# Patient Record
Sex: Male | Born: 1955 | Race: White | Hispanic: No | Marital: Single | Smoking: Former smoker
Health system: Southern US, Community
[De-identification: ages and names within clinical notes are randomized; demographics above are authoritative.]

## PROBLEM LIST (undated history)

## (undated) DIAGNOSIS — I639 Cerebral infarction, unspecified: Secondary | ICD-10-CM

## (undated) DIAGNOSIS — E785 Hyperlipidemia, unspecified: Secondary | ICD-10-CM

## (undated) DIAGNOSIS — E079 Disorder of thyroid, unspecified: Secondary | ICD-10-CM

## (undated) DIAGNOSIS — J309 Allergic rhinitis, unspecified: Secondary | ICD-10-CM

## (undated) HISTORY — DX: Hyperlipidemia, unspecified: E78.5

## (undated) HISTORY — DX: Cerebral infarction, unspecified: I63.9

## (undated) HISTORY — DX: Allergic rhinitis, unspecified: J30.9

---

## 2015-02-06 ENCOUNTER — Encounter (HOSPITAL_COMMUNITY): Payer: Self-pay | Admitting: Emergency Medicine

## 2015-02-06 ENCOUNTER — Emergency Department (INDEPENDENT_AMBULATORY_CARE_PROVIDER_SITE_OTHER)
Admission: EM | Admit: 2015-02-06 | Discharge: 2015-02-06 | Disposition: A | Discharge status: Nursing Home (Includes ICF) | Payer: Self-pay | Source: Home / Self Care

## 2015-02-06 DIAGNOSIS — E079 Disorder of thyroid, unspecified: Secondary | ICD-10-CM | POA: Insufficient documentation

## 2015-02-06 DIAGNOSIS — R1012 Left upper quadrant pain: Secondary | ICD-10-CM

## 2015-02-06 DIAGNOSIS — Z79899 Other long term (current) drug therapy: Secondary | ICD-10-CM | POA: Insufficient documentation

## 2015-02-06 DIAGNOSIS — Z7982 Long term (current) use of aspirin: Secondary | ICD-10-CM | POA: Insufficient documentation

## 2015-02-06 DIAGNOSIS — R1013 Epigastric pain: Secondary | ICD-10-CM | POA: Insufficient documentation

## 2015-02-06 LAB — COMPREHENSIVE METABOLIC PANEL
ALT: 19 U/L (ref 17–63)
ANION GAP: 8 (ref 5–15)
AST: 21 U/L (ref 15–41)
Albumin: 4.1 g/dL (ref 3.5–5.0)
Alkaline Phosphatase: 67 U/L (ref 38–126)
BILIRUBIN TOTAL: 1.1 mg/dL (ref 0.3–1.2)
BUN: 14 mg/dL (ref 6–20)
CO2: 25 mmol/L (ref 22–32)
Calcium: 8.8 mg/dL — ABNORMAL LOW (ref 8.9–10.3)
Chloride: 105 mmol/L (ref 101–111)
Creatinine, Ser: 0.82 mg/dL (ref 0.61–1.24)
Glucose, Bld: 123 mg/dL — ABNORMAL HIGH (ref 65–99)
POTASSIUM: 3.5 mmol/L (ref 3.5–5.1)
Sodium: 138 mmol/L (ref 135–145)
TOTAL PROTEIN: 6.4 g/dL — AB (ref 6.5–8.1)

## 2015-02-06 LAB — URINALYSIS, ROUTINE W REFLEX MICROSCOPIC
BILIRUBIN URINE: NEGATIVE
Glucose, UA: NEGATIVE mg/dL
Hgb urine dipstick: NEGATIVE
KETONES UR: NEGATIVE mg/dL
LEUKOCYTES UA: NEGATIVE
NITRITE: NEGATIVE
Protein, ur: NEGATIVE mg/dL
SPECIFIC GRAVITY, URINE: 1.026 (ref 1.005–1.030)
UROBILINOGEN UA: 1 mg/dL (ref 0.0–1.0)
pH: 6 (ref 5.0–8.0)

## 2015-02-06 LAB — CBC
HEMATOCRIT: 42.6 % (ref 39.0–52.0)
Hemoglobin: 15.7 g/dL (ref 13.0–17.0)
MCH: 32.1 pg (ref 26.0–34.0)
MCHC: 36.9 g/dL — ABNORMAL HIGH (ref 30.0–36.0)
MCV: 87.1 fL (ref 78.0–100.0)
Platelets: 159 10*3/uL (ref 150–400)
RBC: 4.89 MIL/uL (ref 4.22–5.81)
RDW: 12.4 % (ref 11.5–15.5)
WBC: 6.7 10*3/uL (ref 4.0–10.5)

## 2015-02-06 LAB — LIPASE, BLOOD: LIPASE: 65 U/L — AB (ref 22–51)

## 2015-02-06 NOTE — Discharge Instructions (Signed)
Abdominal Pain Many things can cause abdominal pain. Usually, abdominal pain is not caused by a disease and will improve without treatment. It can often be observed and treated at home. Your health care provider will do a physical exam and possibly order blood tests and X-rays to help determine the seriousness of your pain. However, in many cases, more time must pass before a clear cause of the pain can be found. Before that point, your health care provider may not know if you need more testing or further treatment. HOME CARE INSTRUCTIONS  Monitor your abdominal pain for any changes. The following actions may help to alleviate any discomfort you are experiencing:  Only take over-the-counter or prescription medicines as directed by your health care provider.  Do not take laxatives unless directed to do so by your health care provider.  Try a clear liquid diet (broth, tea, or water) as directed by your health care provider. Slowly move to a bland diet as tolerated. SEEK MEDICAL CARE IF:  You have unexplained abdominal pain.  You have abdominal pain associated with nausea or diarrhea.  You have pain when you urinate or have a bowel movement.  You experience abdominal pain that wakes you in the night.  You have abdominal pain that is worsened or improved by eating food.  You have abdominal pain that is worsened with eating fatty foods.  You have a fever. SEEK IMMEDIATE MEDICAL CARE IF:   Your pain does not go away within 2 hours.  You keep throwing up (vomiting).  Your pain is felt only in portions of the abdomen, such as the right side or the left lower portion of the abdomen.  You pass bloody or black tarry stools. MAKE SURE YOU:  Understand these instructions.   Will watch your condition.   Will get help right away if you are not doing well or get worse.  Document Released: 03/19/2005 Document Revised: 06/14/2013 Document Reviewed: 02/16/2013 [facility] Patient Information  2015 [place], [place]. This information is not intended to replace advice given to you by your health care provider. Make sure you discuss any questions you have with your health care provider.  Go straight to [facility] ER for further evaluation of LUQ abd pain.

## 2015-02-06 NOTE — ED Notes (Signed)
Pt. reports LUQ pain for several weeks , seen at [facility] urgent care this evening transferred here for further evaluation , denies emesis or diarrhea , no fever or chills.

## 2015-02-06 NOTE — ED Notes (Signed)
C/o left side abd pain radiating to back for 10 years now States 4-5 weeks ago gradually getting worst States hurts worst when laying down and after he eats States feels better when he avoids red meat  No tx done

## 2015-02-06 NOTE — ED Provider Notes (Signed)
CSN: [id number]     Arrival date & time 02/06/15  1931 History   None    Chief Complaint  Patient presents with  . Abdominal Pain   (Consider location/radiation/quality/duration/timing/severity/associated sxs/prior Treatment) HPI Comments: Pt states he has had intermittent upper abdominal pain x ~5 weeks, now radiating to left side of back. States he goggled s/s and "think I have pancreatitis". Pt states he has worsening pain, especially after eating, no prior hx of abdominal issues. Pt denies fever, vomiting or diarrhea, pt also reports weight loss. Denies smoking, drinking, or drug use. 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 No known trauma, illness, or sick contacts. Med hx consists of thyroid disease only reported by pt.  Patient is a 59 y.o. male presenting with abdominal pain. The history is provided by the patient. No language interpreter was used.  Abdominal Pain Pain location:  LUQ Pain quality: aching, cramping and sharp   Pain radiates to:  Back Pain severity:  Moderate Onset quality:  Gradual Duration:  5 weeks Timing:  Intermittent Progression:  Worsening Chronicity:  Recurrent Context: recent travel   Context: not alcohol use, not diet changes, not sick contacts, not suspicious food intake and not trauma   Associated symptoms: nausea   Associated symptoms: no chest pain, no chills, no constipation, no diarrhea, no fever and no vomiting     History reviewed. No pertinent past medical history. No past surgical history on file. History reviewed. No pertinent family history. Social History  Substance Use Topics  . Smoking status: None  . Smokeless tobacco: None  . Alcohol Use: None    Review of Systems  Constitutional: Positive for appetite change. Negative for fever and chills.       Last ate at 11 am(Salmon and cooked salad), started having abdominal pain after eating, hasn't eaten anything since then.   HENT: Negative.   Eyes: Negative.     Respiratory: Negative.   Cardiovascular: Negative for chest pain, palpitations and leg swelling.  Gastrointestinal: Positive for nausea and abdominal pain. Negative for vomiting, diarrhea and constipation.  Endocrine: Negative.   Genitourinary: Negative for difficulty urinating.  Musculoskeletal: Positive for back pain.  Skin: Negative.   Allergic/Immunologic: Negative.   Neurological: Negative.   Hematological: Negative.   Psychiatric/Behavioral: Negative.   All other systems reviewed and are negative.   Allergies  Review of patient's allergies indicates no known allergies.  Home Medications   Prior to Admission medications   Medication Sig Start Date End Date Taking? Authorizing Provider  levothyroxine (SYNTHROID, LEVOTHROID) 125 MCG tablet Take 125 mcg by mouth daily before breakfast.   Yes Historical Provider, MD   BP 128/80 mmHg  Pulse 58  Temp(Src) 98.6 F (37 C) (Oral)  Resp 16  SpO2 97% Physical Exam  Constitutional: He is oriented to person, place, and time. He appears well-developed. He is active and cooperative.  Non-toxic appearance. He does not have a sickly appearance. He does not appear ill. He appears distressed.  Pt is thin, athletic  HENT:  Head: Normocephalic.  Cardiovascular: Normal rate, regular rhythm and normal heart sounds.   Pulmonary/Chest: Effort normal and breath sounds normal.  Abdominal: Soft. Normal appearance. Bowel sounds are increased. There is tenderness in the left upper quadrant. There is guarding.    Neurological: He is alert and oriented to person, place, and time. GCS eye subscore is 4. GCS verbal subscore is 5. GCS motor subscore is 6.  Psychiatric: He has a normal  mood and affect. His speech is normal.  Nursing note and vitals reviewed.   ED Course  Procedures (including critical care time) Labs Review Labs Reviewed - No data to display  Imaging Review No results found.   MDM   1. Left upper quadrant pain     Go  straight to [facility] ER for further evaluation, do not eat or drink anything.   Clancy Gourd, NP 02/06/15 2141

## 2015-02-07 ENCOUNTER — Emergency Department (HOSPITAL_COMMUNITY)
Admission: EM | Admit: 2015-02-07 | Discharge: 2015-02-07 | Disposition: A | Discharge status: Home / Self Care | Payer: Self-pay | Attending: Emergency Medicine | Admitting: Emergency Medicine

## 2015-02-07 ENCOUNTER — Encounter (HOSPITAL_COMMUNITY): Payer: Self-pay | Admitting: Emergency Medicine

## 2015-02-07 ENCOUNTER — Emergency Department (HOSPITAL_COMMUNITY): Payer: Self-pay

## 2015-02-07 DIAGNOSIS — R109 Unspecified abdominal pain: Secondary | ICD-10-CM

## 2015-02-07 HISTORY — DX: Disorder of thyroid, unspecified: E07.9

## 2015-02-07 MED ORDER — PANTOPRAZOLE SODIUM 40 MG PO TBEC
40.0000 mg | DELAYED_RELEASE_TABLET | Freq: Every day | ORAL | Status: DC
  Administered 2015-02-07: 40 mg via ORAL
  Filled 2015-02-07: qty 1

## 2015-02-07 MED ORDER — OMEPRAZOLE 20 MG PO CPDR: 20.0000 mg | DELAYED_RELEASE_CAPSULE | Freq: Every day | ORAL | Status: DC

## 2015-02-07 MED ORDER — GI COCKTAIL ~~LOC~~
30.0000 mL | Freq: Once | ORAL | Status: AC
  Administered 2015-02-07: 30 mL via ORAL
  Filled 2015-02-07: qty 30

## 2015-02-07 NOTE — ED Notes (Signed)
Dr. Horton at the bedside.  

## 2015-02-07 NOTE — Discharge Instructions (Signed)
You were seen today for abdominal pain. Your ultrasound is negative and reassuring. Your workup is reassuring. You need follow-up with primary physician and may need further evaluation. If your pain worsens or you develop new symptoms you should be more emergently evaluated.  Abdominal Pain Many things can cause abdominal pain. Usually, abdominal pain is not caused by a disease and will improve without treatment. It can often be observed and treated at home. Your health care provider will do a physical exam and possibly order blood tests and X-rays to help determine the seriousness of your pain. However, in many cases, more time must pass before a clear cause of the pain can be found. Before that point, your health care provider may not know if you need more testing or further treatment. HOME CARE INSTRUCTIONS  Monitor your abdominal pain for any changes. The following actions may help to alleviate any discomfort you are experiencing:  Only take over-the-counter or prescription medicines as directed by your health care provider.  Do not take laxatives unless directed to do so by your health care provider.  Try a clear liquid diet (broth, tea, or water) as directed by your health care provider. Slowly move to a bland diet as tolerated. SEEK MEDICAL CARE IF:  You have unexplained abdominal pain.  You have abdominal pain associated with nausea or diarrhea.  You have pain when you urinate or have a bowel movement.  You experience abdominal pain that wakes you in the night.  You have abdominal pain that is worsened or improved by eating food.  You have abdominal pain that is worsened with eating fatty foods.  You have a fever. SEEK IMMEDIATE MEDICAL CARE IF:   Your pain does not go away within 2 hours.  You keep throwing up (vomiting).  Your pain is felt only in portions of the abdomen, such as the right side or the left lower portion of the abdomen.  You pass bloody or black tarry  stools. MAKE SURE YOU:  Understand these instructions.   Will watch your condition.   Will get help right away if you are not doing well or get worse.  Document Released: 03/19/2005 Document Revised: 06/14/2013 Document Reviewed: 02/16/2013 [facility] Patient Information 2015 [place], [place]. This information is not intended to replace advice given to you by your health care provider. Make sure you discuss any questions you have with your health care provider.

## 2015-02-07 NOTE — ED Provider Notes (Signed)
CSN: [id number]     Arrival date & time 02/06/15  2127 History   This chart was scribed for Shon Baton, MD by Arlan Organ, ED Scribe. This patient was seen in room D31C/D31C and the patient's care was started 12:54 AM.   Chief Complaint  Patient presents with  . Abdominal Pain   The history is provided by the patient. No language interpreter was used.    HPI Comments: Curtis Frank is a 59 y.o. male with a PMHx of thyroid disease who presents to the Emergency Department complaining of intermittent, ongoing diffuse abdominal pain that radiates to the back x few months; worsened and constant in last few weeks. Currently he rates pain 1.5/10 and described as pressure. Discomfort is exacerbated with greasy foods and when laying down. No alleviating factors at this time. No OTC medications or home remedies attempted prior to arrival. Denies attempting any acid reducing medications. No recent fever, chills, nausea, vomiting, shortness of breath, or chest pain. Curtis Frank takes Synthroid daily. Pt was seen at Urgent Care for same and was sent over for further evaluation this evening. No known allergies to medications.  Past Medical History  Diagnosis Date  . Thyroid disease    History reviewed. No pertinent past surgical history. History reviewed. No pertinent family history. Social History  Substance Use Topics  . Smoking status: Never Smoker   . Smokeless tobacco: None  . Alcohol Use: No    Review of Systems  Constitutional: Negative for fever and chills.  Respiratory: Negative for cough and shortness of breath.   Cardiovascular: Negative for chest pain.  Gastrointestinal: Positive for abdominal pain. Negative for nausea and vomiting.  Genitourinary: Negative for dysuria.  Musculoskeletal: Negative for back pain.  Neurological: Negative for headaches.  Psychiatric/Behavioral: Negative for confusion.  All other systems reviewed and are negative.     Allergies  Review of  patient's allergies indicates no known allergies.  Home Medications   Prior to Admission medications   Medication Sig Start Date End Date Taking? Authorizing Provider  aspirin EC 81 MG tablet Take 81 mg by mouth daily.   Yes Historical Provider, MD  levothyroxine (SYNTHROID, LEVOTHROID) 125 MCG tablet Take 125 mcg by mouth daily before breakfast.   Yes Historical Provider, MD  omeprazole (PRILOSEC) 20 MG capsule Take 1 capsule (20 mg total) by mouth daily. 02/07/15   Shon Baton, MD   Triage Vitals: BP 109/65 mmHg  Pulse 50  Temp(Src) 98.1 F (36.7 C) (Oral)  Resp 14  Ht  (1.803 m)  Wt 155 lb (70.308 kg)  BMI 21.63 kg/m2  SpO2 100%   Physical Exam  Constitutional: He is oriented to person, place, and time. He appears well-developed and well-nourished.  HENT:  Head: Normocephalic and atraumatic.  Eyes: Pupils are equal, round, and reactive to light.  Cardiovascular: Normal rate, regular rhythm and normal heart sounds.   No murmur heard. Pulmonary/Chest: Effort normal and breath sounds normal. No respiratory distress. He has no wheezes.  Abdominal: Soft. Bowel sounds are normal. There is tenderness. There is no rebound and no guarding.  Tenderness to palpation of the epigastrium and just left the epigastrium, no rebound or guarding  Musculoskeletal: He exhibits no edema.  Neurological: He is alert and oriented to person, place, and time.  Skin: Skin is warm and dry.  Psychiatric: He has a normal mood and affect.  Nursing note and vitals reviewed.   ED Course  Procedures (including critical care time)  DIAGNOSTIC STUDIES: Oxygen Saturation is 99% on RA, Normal by my interpretation.    COORDINATION OF CARE: 1:00 AM- Will order urinalysis, CBC, CMP, and lipase. Discussed treatment plan with pt at bedside and pt agreed to plan.     Labs Review Labs Reviewed  LIPASE, BLOOD - Abnormal; Notable for the following:    Lipase 65 (*)    All other components within  normal limits  COMPREHENSIVE METABOLIC PANEL - Abnormal; Notable for the following:    Glucose, Bld 123 (*)    Calcium 8.8 (*)    Total Protein 6.4 (*)    All other components within normal limits  CBC - Abnormal; Notable for the following:    MCHC 36.9 (*)    All other components within normal limits  URINALYSIS, ROUTINE W REFLEX MICROSCOPIC (NOT AT [facility]) - Abnormal; Notable for the following:    APPearance HAZY (*)    All other components within normal limits    Imaging Review US Abdomen Limited Ruq  02/07/2015   CLINICAL DATA:  Chronic generalized abdominal pain for 2 months, worse after eating. Initial encounter.  EXAM: US ABDOMEN LIMITED - RIGHT UPPER QUADRANT  COMPARISON:  None.  FINDINGS: Gallbladder:  No gallstones or wall thickening visualized. No sonographic Murphy sign noted.  Common bile duct:  Diameter: 0.3 cm, within normal limits in caliber.  Liver:  No focal lesion identified. Within normal limits in parenchymal echogenicity.  IMPRESSION: Unremarkable ultrasound of the right upper quadrant.   Electronically Signed   By: Roanna Raider M.D.   On: 02/07/2015 02:27   I have personally reviewed and evaluated these images and lab results as part of my medical decision-making.   EKG Interpretation None      MDM   Final diagnoses:  Abdominal pain    Patient presents with several months of abdominal pain. Worsening. Does not have a primary physician. Seen in urgent care and referred here for further management. Location of pain is somewhat odd. History is food related and if pain were in the right upper quadrant, would story was consistent with biliary colic. GERD is also a consideration as well as pancreatitis. Lab work obtained and largely reassuring. Right upper quadrant ultrasound obtained to evaluate the gallbladder and this is normal. Patient given GI cocktail and Protonix. Discussed with patient trialing PPI for possible GERD and follow-up with primary physician. He was  given referrals for these.  After history, exam, and medical workup I feel the patient has been appropriately medically screened and is safe for discharge home. Pertinent diagnoses were discussed with the patient. Patient was given return precautions.  I personally performed the services described in this documentation, which was scribed in my presence. The recorded information has been reviewed and is accurate.   Shon Baton, MD 02/08/15 [phone]

## 2015-11-01 IMAGING — US US ABDOMEN LIMITED
1 series · 14 of 25 positions shown · non-contrast
Comparison: None.

CLINICAL DATA: Chronic generalized abdominal pain for 2 months,
worse after eating. Initial encounter.

EXAM:
US ABDOMEN LIMITED - RIGHT UPPER QUADRANT

[Series 1: us abdomen limited · 0.17mm/px · 14 of 48 slices shown]
[im 1/48]
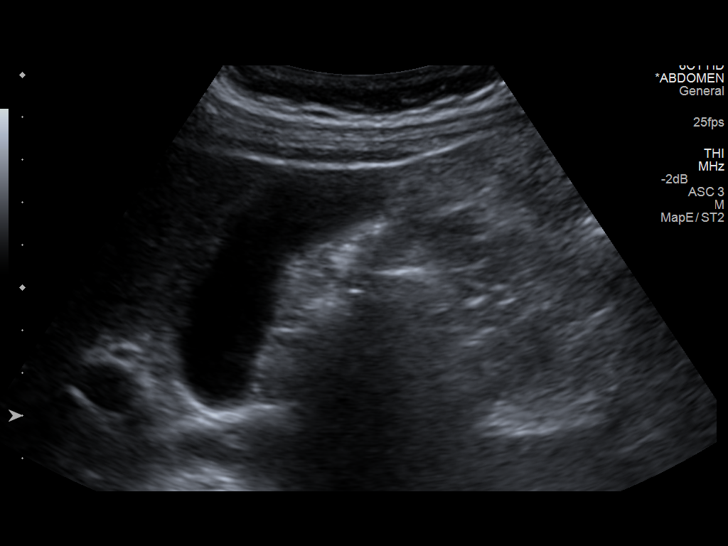
[im 4/48]
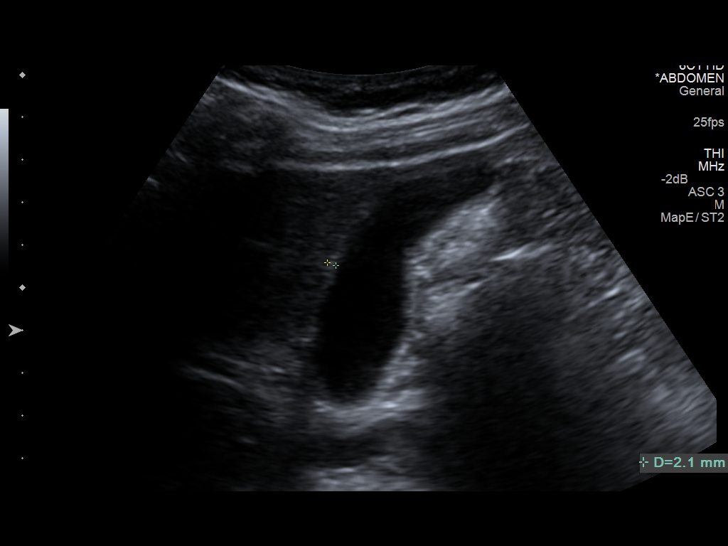
[im 8/48]
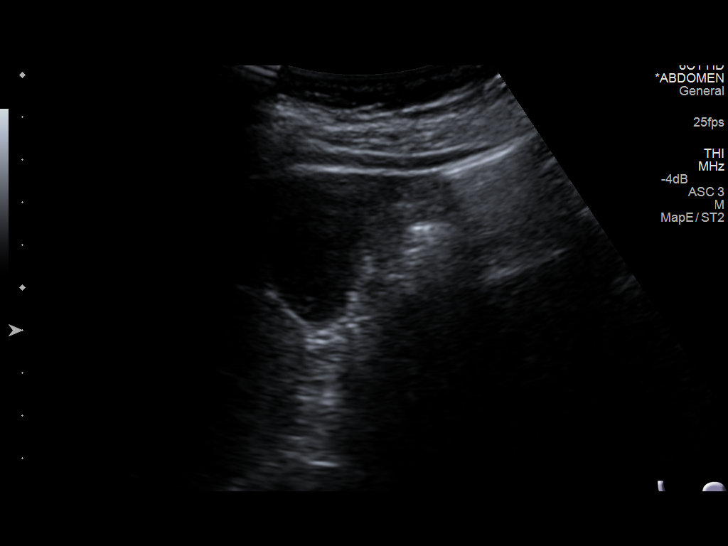
[im 12/48]
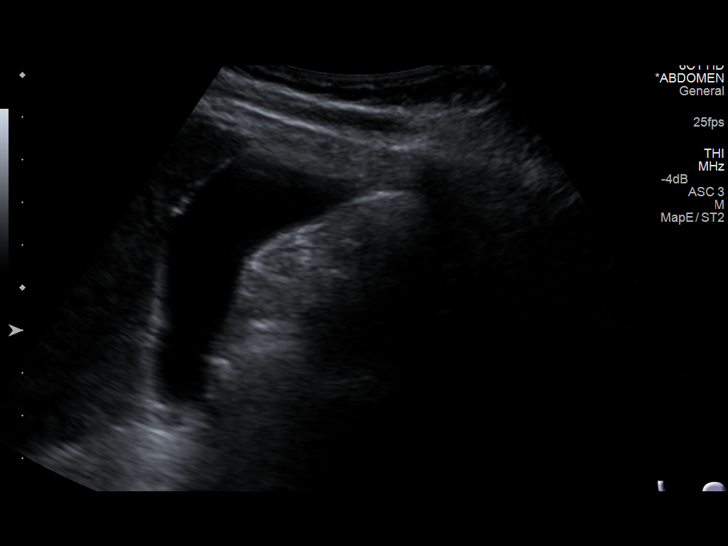
[im 16/48]
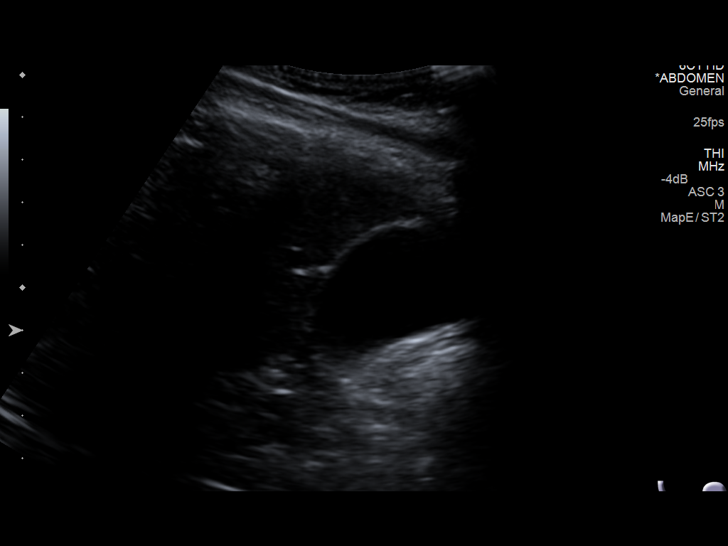
[im 18/48]
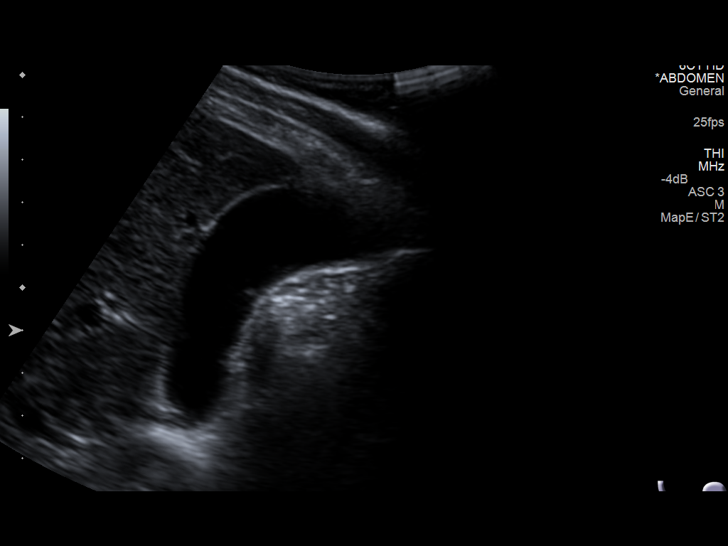
[im 22/48]
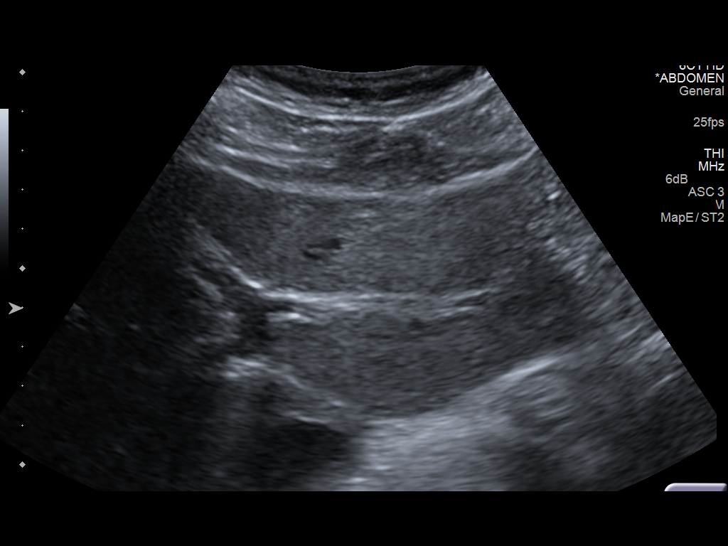
[im 26/48]
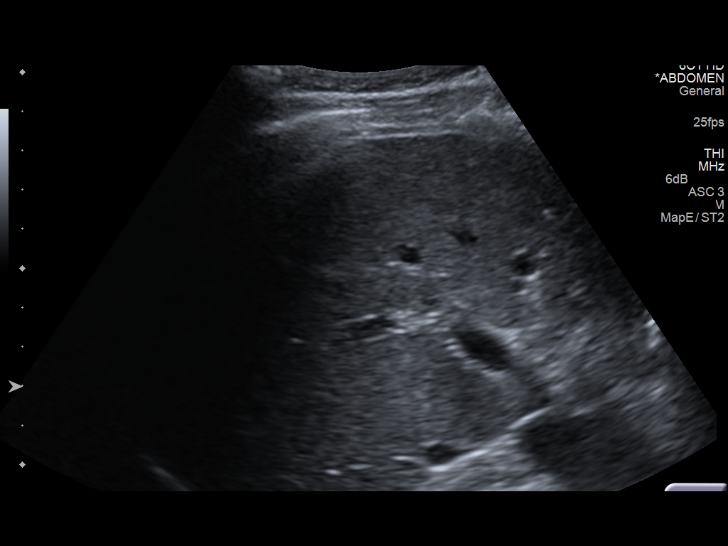
[im 30/48]
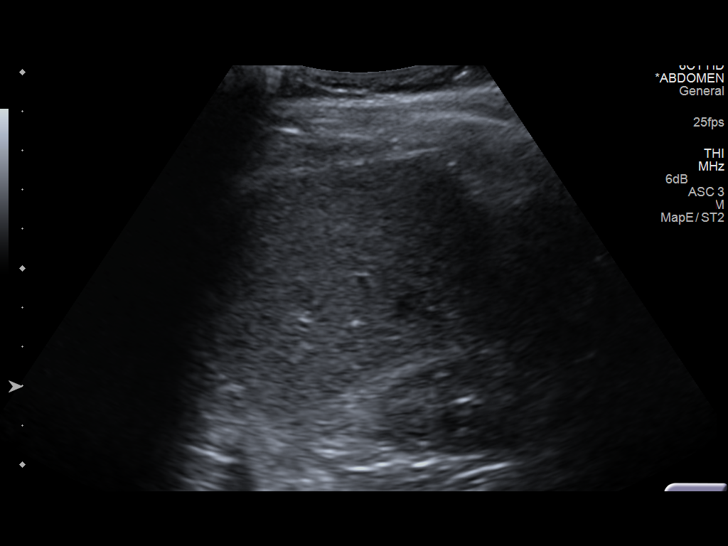
[im 32/48]
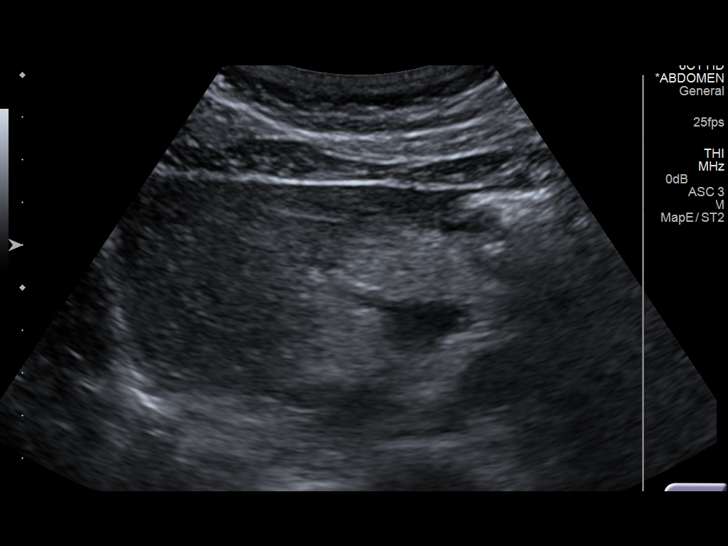
[im 36/48]
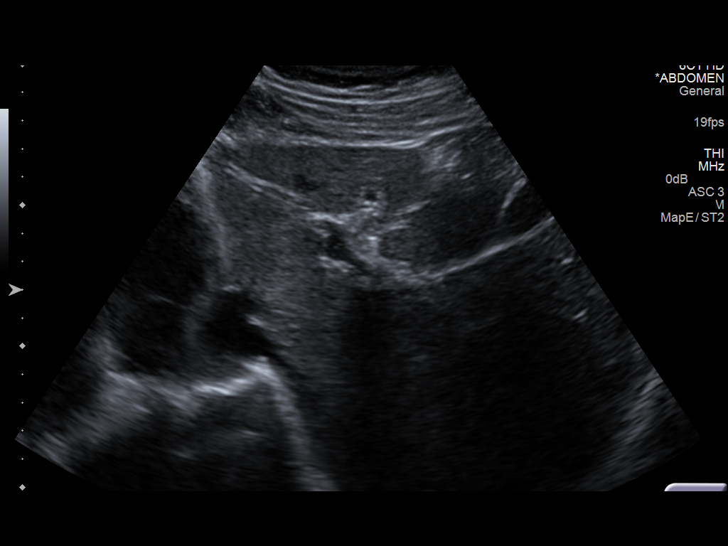
[im 40/48]
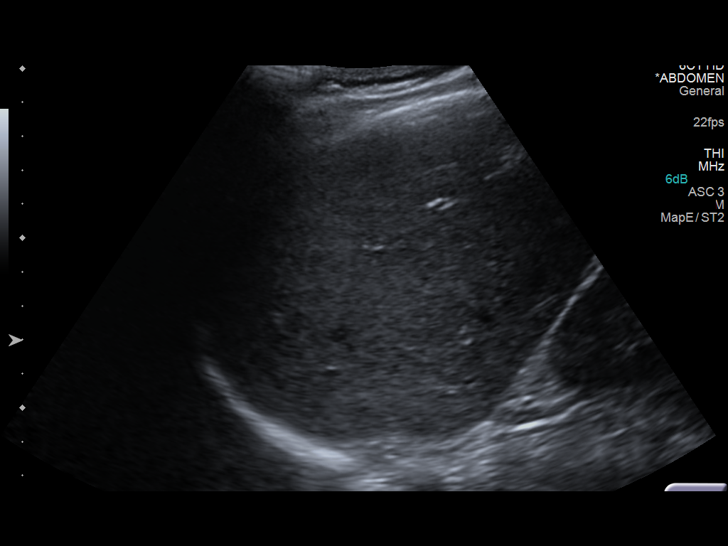
[im 44/48]
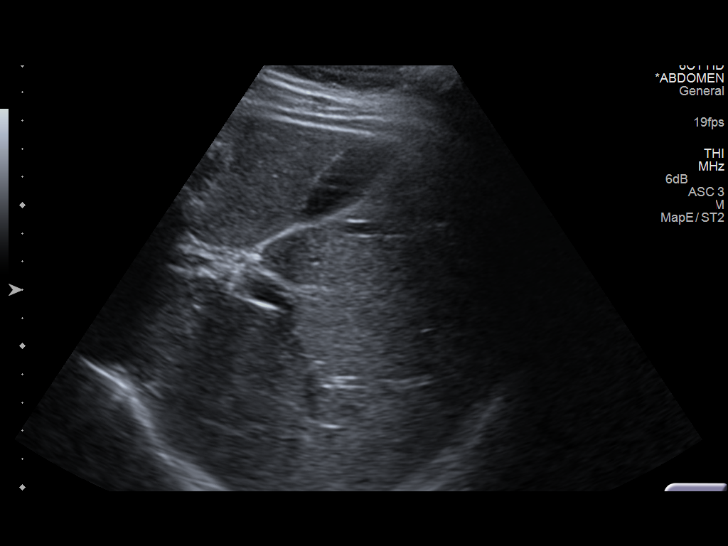
[im 48/48]
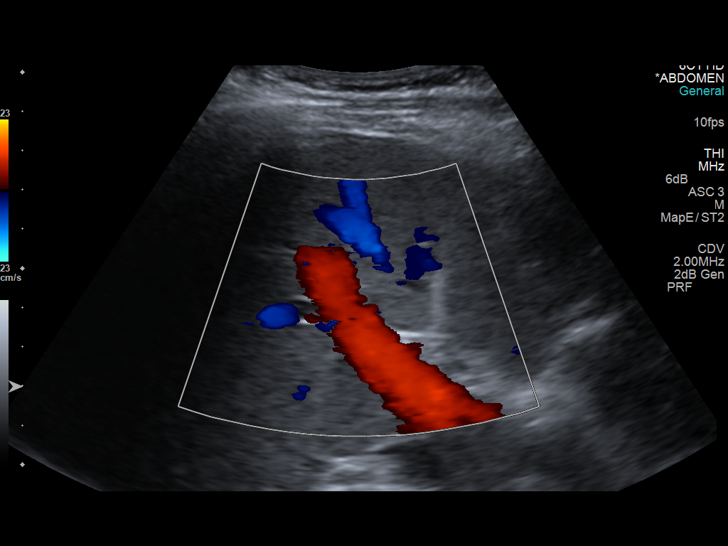

[14 of 25 positions shown; findings below may reference images not displayed]

FINDINGS: Gallbladder:

No gallstones or wall thickening visualized. No sonographic Murphy
sign noted.

Common bile duct:

Diameter: 0.3 cm, within normal limits in caliber.

Liver:

No focal lesion identified. Within normal limits in parenchymal
echogenicity.
IMPRESSION: Unremarkable ultrasound of the right upper quadrant.

## 2019-07-04 ENCOUNTER — Encounter: Payer: Self-pay | Admitting: Pulmonary Disease

## 2019-07-04 ENCOUNTER — Ambulatory Visit (INDEPENDENT_AMBULATORY_CARE_PROVIDER_SITE_OTHER): Payer: 59 | Admitting: Pulmonary Disease

## 2019-07-04 ENCOUNTER — Ambulatory Visit (INDEPENDENT_AMBULATORY_CARE_PROVIDER_SITE_OTHER): Payer: 59

## 2019-07-04 ENCOUNTER — Other Ambulatory Visit: Payer: Self-pay

## 2019-07-04 ENCOUNTER — Institutional Professional Consult (permissible substitution): Payer: Self-pay | Admitting: Pulmonary Disease

## 2019-07-04 VITALS — BP 108/58 | HR 56 | Temp 97.7°F | Ht 71.0 in | Wt 153.0 lb

## 2019-07-04 DIAGNOSIS — B382 Pulmonary coccidioidomycosis, unspecified: Secondary | ICD-10-CM | POA: Diagnosis not present

## 2019-07-04 DIAGNOSIS — B380 Acute pulmonary coccidioidomycosis: Secondary | ICD-10-CM

## 2019-07-04 MED ORDER — ATORVASTATIN CALCIUM 40 MG PO TABS: 40.0000 mg | ORAL_TABLET | Freq: Every day | ORAL | 3 refills | Status: DC

## 2019-07-04 MED ORDER — FLUCONAZOLE 200 MG PO TABS: 200.0000 mg | ORAL_TABLET | Freq: Every day | ORAL | 1 refills | Status: DC

## 2019-07-04 NOTE — Patient Instructions (Signed)
Chest xray today  Follow up in 6 weeks 

## 2019-07-04 NOTE — Progress Notes (Signed)
[facility]  Chief Complaint  Patient presents with  . Consult    Acute Valley Fever, SOB with exercie, fatigue    Constitutional:  BP (!) 108/58 (BP Location: Right Arm, Cuff Size: Normal)   Pulse (!) 56   Temp 97.7 F (36.5 C) (Temporal)   Ht 5\' 11"  (1.803 m)   Wt 153 lb (69.4 kg)   SpO2 98% Comment: RA  BMI 21.34 kg/m   Past Medical History:  Valley Fever, CVA  Brief Summary:  Saxon Barich is a 64 y.o. male with Valley Fever.  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  After the start of the pandemic tours were limited.  He likes to smoke marijuana.  He enjoyed his free time and would go into the wilderness and smoke marijuana.  These were rugged environments with lots of dust exposure.  He presented to [facility] and then transferred to [facility] in [place] on 06/05/19 with difficulty speaking.  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  He was also found to have a Rt lung mass.  He was seen by pulmonary and had bronchoscopy with biopsy.  He also had fine needle aspiration.  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  He was started on therapy with diflucan.  He has since moved to [place] and needed to establish care with pulmonary.    He does not recall having fever, sweats, or weight loss.  No joint complaints.  No skin rashes or bumps.    Physical Exam:   Appearance - well kempt   ENMT - clear nasal mucosa, midline nasal  septum, no oral exudates, no LAN, trachea midline  Respiratory - normal chest wall, normal respiratory effort, no accessory muscle use, no wheeze/rales  CV - s1s2 regular rate and rhythm, no murmurs, no peripheral edema, radial pulses symmetric  GI - soft, non tender, no masses  Lymph - no adenopathy noted in neck and axillary areas  MSK - normal gait  Ext - no cyanosis, clubbing, or joint inflammation noted  Skin - no rashes, lesions, or  ulcers  Neuro - normal strength, oriented x 3  Psych - normal mood and affect   Assessment/Plan:   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 - reviewed his medical records from [place] - has been on diflucan therapy since mid December 2020 >> would need about 3 months of therapy in total - repeat chest xray today and then determine whether he will need repeat CT chest - will need to do complement fixing anticoccidioidal Ab at next visit; if level great than 1:32, then concern for continued fungal activity  Hx of CVA. - advised him to arrange for primary care and then determine whether he needs follow up with neurology  Marijuana abuse. - advised him it is best to avoid smoking any kind of substances   Patient Instructions  Chest xray today  Follow up in 6 weeks  A total of  46 minutes were spent face to face and non face to face with the patient and more than half of that time involved counseling or coordination of care.   Chesley Mires, MD [facility] Pager: [phone] 07/04/2019, 2:38 PM  Flow Sheet     Pulmonary tests:    Chest imaging:  CT chest 06/06/19 >> lobulated centrally hypodense mass RUL 3.3 x 2.2 cm  Cardiac tests:  Echo 06/06/19 >>  EF 55 to 60%  Medications:   Allergies as of 07/04/2019   No Known Allergies     Medication List       Accurate as of July 04, 2019  2:38 PM. If you have any questions, ask your nurse or doctor.        STOP taking these medications   omeprazole 20 MG capsule Commonly known as: PRILOSEC Stopped by: Coralyn Helling, MD     TAKE these medications   aspirin EC 81 MG tablet Take 81 mg by mouth daily.   atorvastatin 40 MG tablet Commonly known as: LIPITOR Take 40 mg by mouth daily.   fluconazole 200 MG tablet Commonly known as: DIFLUCAN Take 200 mg by mouth daily.   levothyroxine 125 MCG tablet Commonly known as: SYNTHROID Take 125 mcg by mouth daily before  breakfast.       Past Surgical History:  He  has no past surgical history on file.  Family History:  His family history includes Breast cancer in his mother; Heart disease in his father; Kidney cancer in his father; Liver cancer in his father.  Social History:  He  reports that he quit smoking about 15 years ago. His smoking use included cigarettes. He has a 1.25 pack-year smoking history. He has never used smokeless tobacco. He reports current drug use. Drug: Marijuana. He reports that he does not drink alcohol.

## 2019-07-06 ENCOUNTER — Telehealth: Payer: Self-pay | Admitting: Pulmonary Disease

## 2019-07-06 NOTE — Telephone Encounter (Signed)
Called the patient and made him aware of the results. Patient voiced understanding. Nothing further needed at that time.

## 2019-07-06 NOTE — Telephone Encounter (Signed)
DG Chest 2 View  Result Date: 07/04/2019 CLINICAL DATA:  64 year old male with follow-up valley fever. EXAM: CHEST - 2 VIEW COMPARISON:  None. FINDINGS: Minimal diffuse interstitial prominence/thickening with possible minimal nodularity. There is no focal consolidation, pleural effusion, or pneumothorax. The cardiac silhouette is within normal limits. No acute osseous pathology. IMPRESSION: 1. No focal consolidation. 2. Minimal interstitial prominence. Electronically Signed   By: Elgie Collard M.D.   On: 07/04/2019 19:17    Please let him know that his CXR didn't show any signs of lung mass that was seen on imaging studies from [place].  He should continue taking diflucan to prevent recurrence of valley fever.

## 2019-08-15 ENCOUNTER — Ambulatory Visit: Payer: PRIVATE HEALTH INSURANCE | Admitting: Pulmonary Disease

## 2019-09-02 ENCOUNTER — Other Ambulatory Visit: Payer: Self-pay

## 2019-09-02 ENCOUNTER — Other Ambulatory Visit: Payer: 59

## 2019-09-02 ENCOUNTER — Ambulatory Visit (INDEPENDENT_AMBULATORY_CARE_PROVIDER_SITE_OTHER): Payer: 59 | Admitting: Pulmonary Disease

## 2019-09-02 ENCOUNTER — Encounter: Payer: Self-pay | Admitting: Pulmonary Disease

## 2019-09-02 VITALS — BP 120/90 | HR 53 | Temp 97.9°F | Ht 71.0 in | Wt 166.0 lb

## 2019-09-02 DIAGNOSIS — Z0000 Encounter for general adult medical examination without abnormal findings: Secondary | ICD-10-CM

## 2019-09-02 DIAGNOSIS — Z8619 Personal history of other infectious and parasitic diseases: Secondary | ICD-10-CM

## 2019-09-02 NOTE — Addendum Note (Signed)
Addended by: Ander Slade on: 09/02/2019 09:58 AM   Modules accepted: Orders

## 2019-09-02 NOTE — Progress Notes (Signed)
[facility]  Chief Complaint  Patient presents with  . Follow-up    [facility] Fever [facility])    Constitutional:  BP 120/90 (BP Location: Right Arm, Patient Position: Sitting, Cuff Size: Normal)   Pulse (!) 53   Temp 97.9 F (36.6 C)   Ht 5\' 11"  (1.803 m)   Wt 166 lb (75.3 kg)   SpO2 97% Comment: on room air  BMI 23.15 kg/m   Past Medical History:  [place] Fever, CVA, HLD, Hypothyroidism  Brief Summary:  Curtis Frank is a 64 y.o. male with [place] Fever.  He had episode couple of weeks ago with itchy eyes, sneezing and chest congestion with discomfort.  Lasted for a couple of days and then resolved.  Not having cough, fever, skin rash, joint pain, hemoptysis, gland swelling.  Weight steady.  No issues with breathing while walking.  CXR from January (reviewed by me) did not show any mass lesions.  Physical Exam:   Appearance - well kempt   ENMT - no sinus tenderness, no nasal discharge, no oral exudate  Neck - no masses, trachea midline, no thyromegaly, no elevation in JVP  Respiratory - normal appearance of chest wall, normal respiratory effort w/o accessory muscle use, no dullness on percussion, no wheezing or rales  CV - s1s2 regular rate and rhythm, no murmurs, no peripheral edema, radial pulses symmetric  Lymph - no adenopathy noted in neck and axillary areas  Ext - no cyanosis, clubbing, or joint inflammation noted  Skin - no rashes, lesions, or ulcers  Psych - normal mood and affect   Assessment/Plan:   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 - advised he needs PCP for health maintenance care - referral made for new PCP  Allergic rhinitis. -  suspect symptoms from couple weeks ago related to [name] allergies in the late Winter/early Spring - prn OTC antihistamine if recurs    Patient Instructions  Lab test today to check Coccidiodes antibody levels; will call with results and let you know if you can stop taking diflucan  Will arrange for referral to set up primary care provider  Follow up in 4 months   Time spent 25 minutes   Chesley Mires, MD [place] Pager: [phone] 09/02/2019, 9:48 AM  Flow Sheet    Chest imaging:  CT chest 06/06/19 >> lobulated centrally hypodense mass RUL 3.3 x 2.2 cm  Cardiac tests:  Echo 06/06/19 >> EF 55 to 60%  Medications:   Allergies as of 09/02/2019   No Known Allergies     Medication List       Accurate as of September 02, 2019  9:48 AM. If you have any questions, ask your nurse or doctor.        aspirin EC 81 MG tablet Take 81 mg by mouth daily.   atorvastatin 40 MG tablet Commonly known as: LIPITOR Take 1 tablet (40 mg total) by mouth daily.   fluconazole 200 MG tablet Commonly known as: DIFLUCAN Take 1 tablet (200 mg total) by mouth daily.   levothyroxine 150 MCG tablet Commonly known as: SYNTHROID Take 150 mcg by mouth daily. What changed: Another medication with the same name was removed. Continue taking this medication,  and follow the directions you see here. Changed by: Coralyn Helling, MD       Past Surgical History:  He  has no past surgical history on file.  Family History:  His family history includes Breast cancer in his mother; Heart disease in his father; Kidney cancer in his father; Liver cancer in his father.  Social History:  He  reports that he quit smoking about 15 years ago. His smoking use included cigarettes. He has a 1.25 pack-year smoking history. He has never used smokeless tobacco. He reports current drug use. Drug: Marijuana. He reports that he does not drink alcohol.

## 2019-09-02 NOTE — Patient Instructions (Signed)
Lab test today to check Coccidiodes antibody levels; will call with results and let you know if you can stop taking diflucan  Will arrange for referral to set up primary care provider  Follow up in 4 months

## 2019-09-08 LAB — COCCIDIOIDES ANTIBODIES: COCCIDIOIDES AB, CF, SERUM: 1:16 {titer} — ABNORMAL HIGH

## 2019-09-13 ENCOUNTER — Telehealth: Payer: Self-pay | Admitting: Pulmonary Disease

## 2019-09-13 NOTE — Telephone Encounter (Signed)
Spoke with pt. He is requesting his lab results from last week. States that the labs were going to determine if he needed to keep taking a certain medication.  Dr. Craige Cotta - please advise. Thanks.

## 2019-09-13 NOTE — Telephone Encounter (Signed)
I called and spoke with the pt and notified of his lab results and the recommendations per Dr Craige Cotta and he verbalized understanding. I placed a 4 month reminder for him to f/u in the late summer.

## 2019-09-13 NOTE — Telephone Encounter (Signed)
Please let him know his antibody levels are in acceptable range.  He can stop taking fluconazole (diflucan).  He should call if he notices recurrence of symptoms similar to what he had in December 2020.  Otherwise he should f/u later this Summer.

## 2019-09-13 NOTE — Telephone Encounter (Signed)
Please read my message from 2:09 today.

## 2019-09-19 ENCOUNTER — Other Ambulatory Visit: Payer: Self-pay | Admitting: Pulmonary Disease

## 2019-11-25 ENCOUNTER — Encounter: Payer: Self-pay | Admitting: Pulmonary Disease

## 2019-11-25 ENCOUNTER — Ambulatory Visit (INDEPENDENT_AMBULATORY_CARE_PROVIDER_SITE_OTHER): Payer: 59 | Admitting: Pulmonary Disease

## 2019-11-25 ENCOUNTER — Other Ambulatory Visit: Payer: Self-pay

## 2019-11-25 VITALS — BP 112/70 | HR 50 | Temp 97.8°F | Ht 71.0 in | Wt 173.6 lb

## 2019-11-25 DIAGNOSIS — B382 Pulmonary coccidioidomycosis, unspecified: Secondary | ICD-10-CM | POA: Diagnosis not present

## 2019-11-25 DIAGNOSIS — B380 Acute pulmonary coccidioidomycosis: Secondary | ICD-10-CM

## 2019-11-25 NOTE — Patient Instructions (Signed)
Good luck with your move back to [place]

## 2019-11-25 NOTE — Progress Notes (Signed)
[facility]  Chief Complaint  Patient presents with  . Follow-up    [facility]    Constitutional:  BP 112/70 (BP Location: Left Arm, Cuff Size: Normal)   Pulse (!) 50   Temp 97.8 F (36.6 C) (Oral)   Ht 5\' 11"  (1.803 m)   Wt 173 lb 9.6 oz (78.7 kg)   SpO2 97%   BMI 24.21 kg/m   Past Medical History:  Curtis Frank, CVA, HLD, Hypothyroidism  Brief Summary:  [name] is a 64 y.o. male with Curtis Frank.  Subjective:  He has been off fluconazole since March.    He has been feeling well.  Not having cough, wheeze, sputum, hemoptysis, Frank, sweats, weight loss, skin rash, gland swelling, or headache.    Chest xray from 07/04/19 did not show any mass or infiltrate.  Curtis Frank antibody was 1:16 from 09/02/19.  He is moving back to 11/02/19 and will start working as a [place] in the [employer] again.   Physical Exam:   Appearance - well kempt   ENMT - no sinus tenderness, no oral exudate, no LAN, Mallampati 2 airway, no stridor  Respiratory - equal breath sounds bilaterally, no wheezing or rales  CV - s1s2 regular rate and rhythm, no murmurs  Ext - no clubbing, no edema  Skin - no rashes  Psych - normal mood and affect   Assessment/Plan:   Curtis Frank immitis infection [facility] Frank). - dx in December 2020 while living in [place] - off fluconazole since March 2021 w/o complication - no further pulmonary follow up needed   Hx of CVA, HLD, Hypothyroidism. - he has PCP set up in April 2021  Allergic rhinitis. - prn OTC antihistamines  Follow up:   Patient Instructions  Good luck with your move back to [place]  Signature:  [place], MD [facility] Pager: [phone] 11/25/2019, 10:05 AM  Flow Sheet    Chest imaging:   CT chest 06/06/19 >> lobulated centrally hypodense mass RUL 3.3 x 2.2 cm  Cardiac tests:   Echo 06/06/19 >> EF 55 to 60%  Medications:    Allergies as of 11/25/2019   No Known Allergies     Medication List       Accurate as of November 25, 2019 10:05 AM. If you have any questions, ask your nurse or doctor.        STOP taking these medications   fluconazole 200 MG tablet Commonly known as: DIFLUCAN Stopped by: November 27, 2019, MD     TAKE these medications   aspirin EC 81 MG tablet Take 81 mg by mouth daily.   atorvastatin 40 MG tablet Commonly known as: LIPITOR TAKE 1 TABLET BY MOUTH EVERY DAY   levothyroxine 150 MCG tablet Commonly known as: SYNTHROID Take 150 mcg by mouth daily.       Past Surgical History:  He  has no past surgical history on file.  Family History:  His family history includes Breast cancer in his mother; Heart disease in his father; Kidney cancer in his father; Liver cancer in his father.  Social History:  He  reports that he quit smoking about 15 years ago. His smoking use included cigarettes. He has a 1.25 pack-year smoking history. He has never used smokeless tobacco. He reports current drug use. Drug: Marijuana. He reports that he does not drink alcohol.

## 2019-12-13 ENCOUNTER — Other Ambulatory Visit: Payer: Self-pay | Admitting: Pulmonary Disease

## 2020-06-19 IMAGING — DX DG CHEST 2V
2 series · 2 of 2 positions shown · non-contrast
Comparison: None.

CLINICAL DATA: 63-year-old male with follow-up [REDACTED] fever.

EXAM:
CHEST - 2 VIEW

[chest pa]
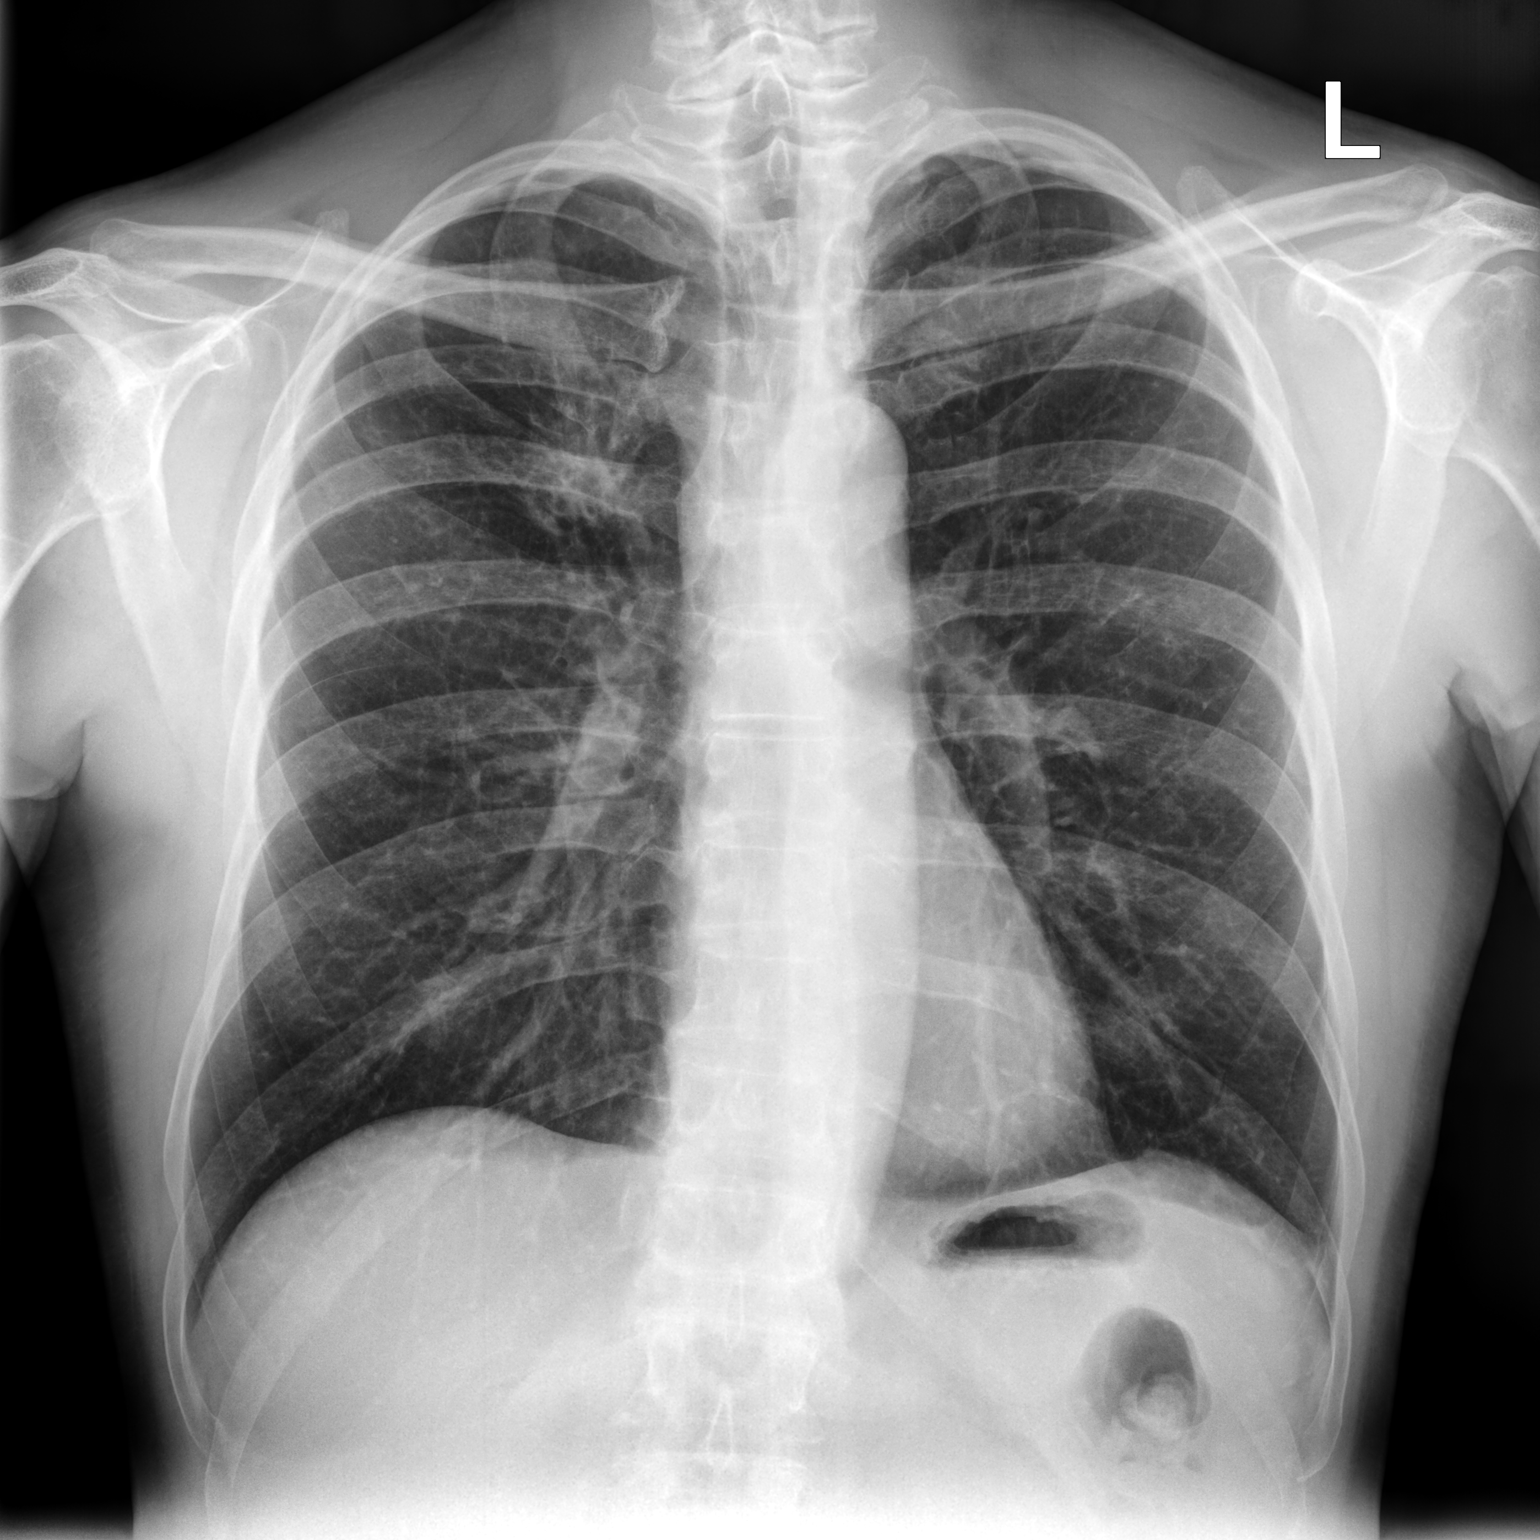

[chest lat]
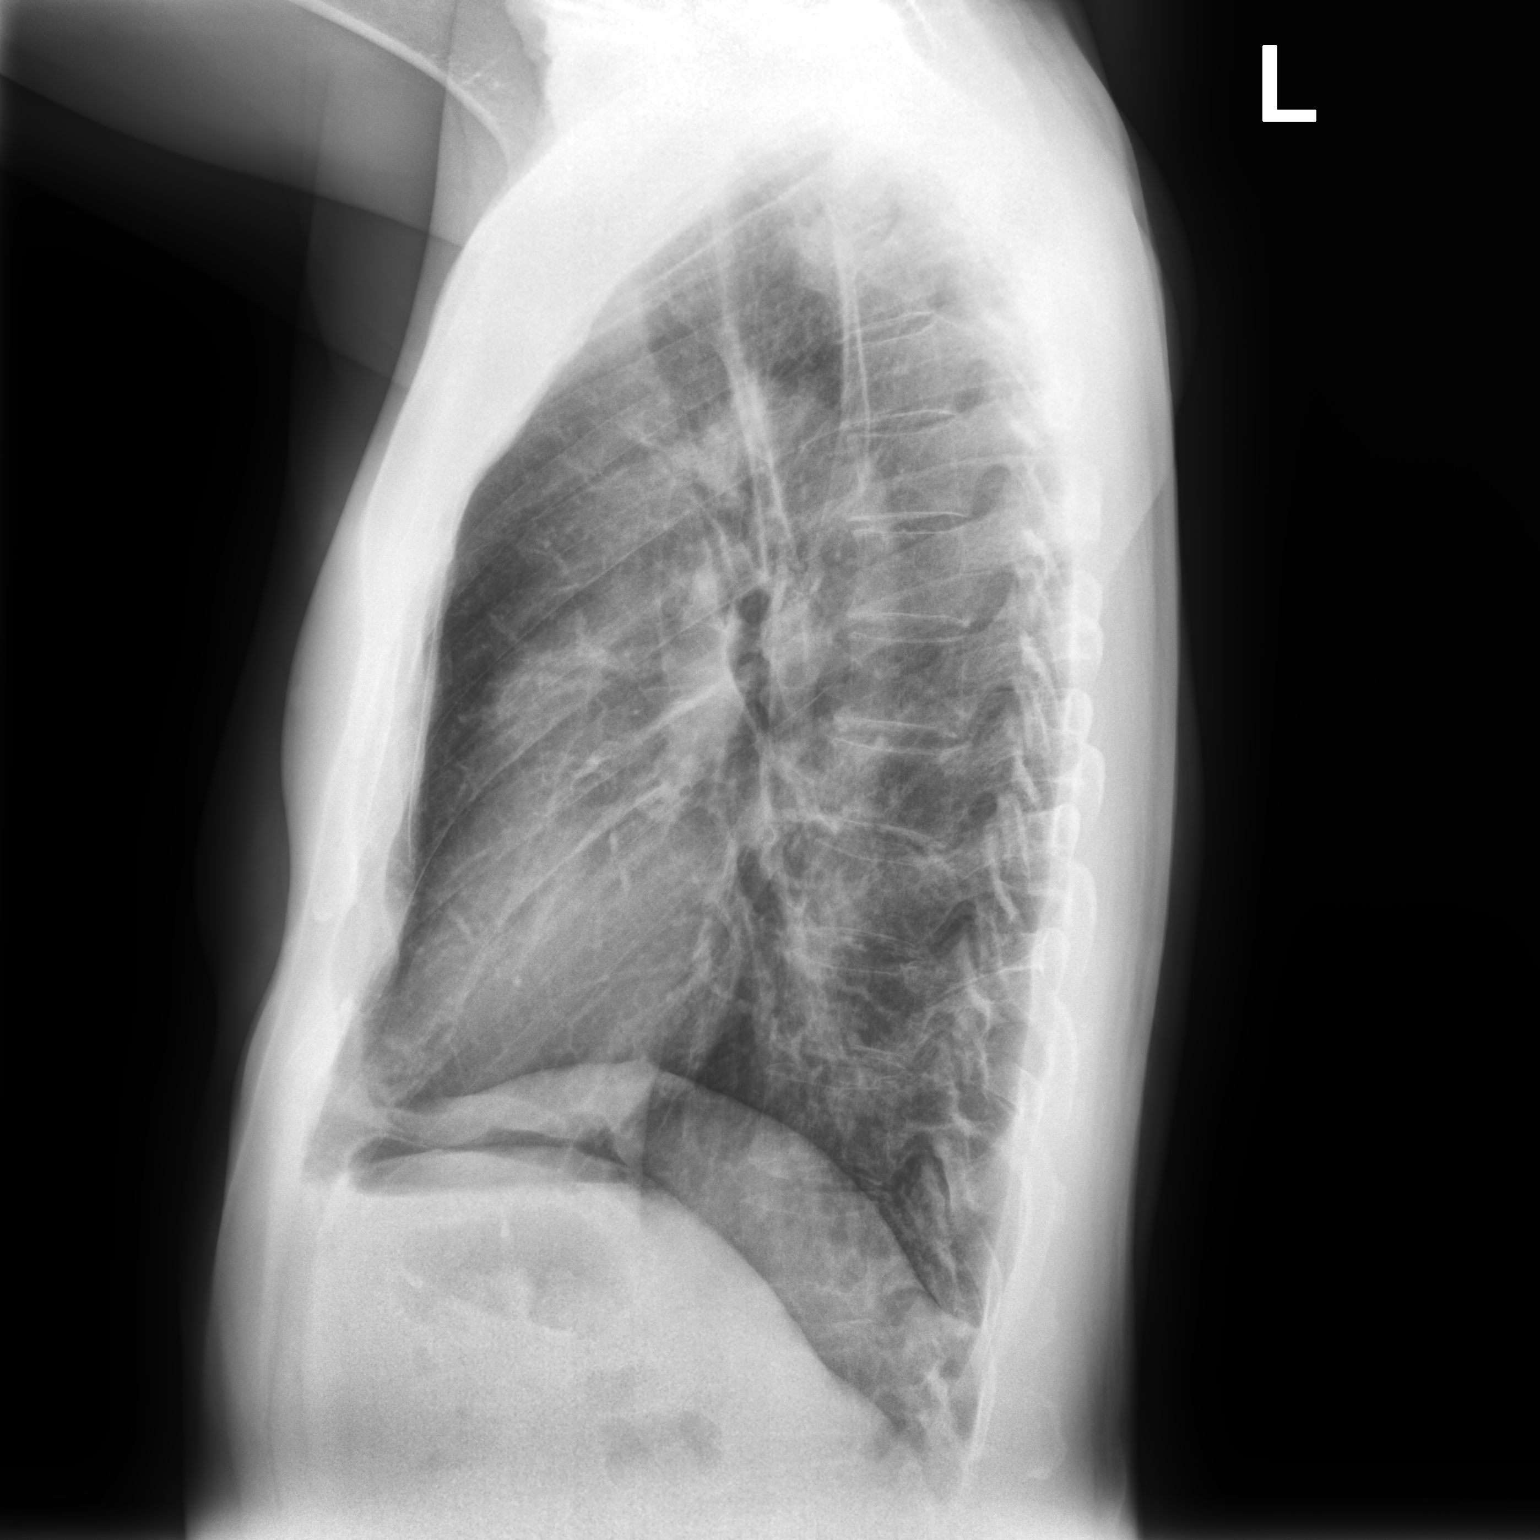

[2 of 2 positions shown; findings below may reference images not displayed]

FINDINGS: Minimal diffuse interstitial prominence/thickening with possible
minimal nodularity. There is no focal consolidation, pleural
effusion, or pneumothorax. The cardiac silhouette is within normal
limits. No acute osseous pathology.
IMPRESSION: 1. No focal consolidation.
2. Minimal interstitial prominence.

## 2021-08-27 DIAGNOSIS — Z136 Encounter for screening for cardiovascular disorders: Secondary | ICD-10-CM | POA: Diagnosis not present

## 2021-08-27 DIAGNOSIS — Z125 Encounter for screening for malignant neoplasm of prostate: Secondary | ICD-10-CM | POA: Diagnosis not present

## 2021-08-27 DIAGNOSIS — E039 Hypothyroidism, unspecified: Secondary | ICD-10-CM | POA: Diagnosis not present

## 2021-08-27 DIAGNOSIS — Z0000 Encounter for general adult medical examination without abnormal findings: Secondary | ICD-10-CM | POA: Diagnosis not present

## 2021-08-27 DIAGNOSIS — C4491 Basal cell carcinoma of skin, unspecified: Secondary | ICD-10-CM | POA: Diagnosis not present

## 2021-08-27 DIAGNOSIS — R7309 Other abnormal glucose: Secondary | ICD-10-CM | POA: Diagnosis not present

## 2021-12-02 DIAGNOSIS — C44519 Basal cell carcinoma of skin of other part of trunk: Secondary | ICD-10-CM | POA: Diagnosis not present

## 2021-12-02 DIAGNOSIS — C44529 Squamous cell carcinoma of skin of other part of trunk: Secondary | ICD-10-CM | POA: Diagnosis not present

## 2022-06-04 DIAGNOSIS — Z85828 Personal history of other malignant neoplasm of skin: Secondary | ICD-10-CM | POA: Diagnosis not present

## 2022-06-04 DIAGNOSIS — L821 Other seborrheic keratosis: Secondary | ICD-10-CM | POA: Diagnosis not present

## 2022-06-04 DIAGNOSIS — D1801 Hemangioma of skin and subcutaneous tissue: Secondary | ICD-10-CM | POA: Diagnosis not present

## 2022-06-04 DIAGNOSIS — D229 Melanocytic nevi, unspecified: Secondary | ICD-10-CM | POA: Diagnosis not present

## 2022-08-29 DIAGNOSIS — G729 Myopathy, unspecified: Secondary | ICD-10-CM | POA: Diagnosis not present

## 2022-08-29 DIAGNOSIS — E039 Hypothyroidism, unspecified: Secondary | ICD-10-CM | POA: Diagnosis not present

## 2022-08-29 DIAGNOSIS — E785 Hyperlipidemia, unspecified: Secondary | ICD-10-CM | POA: Diagnosis not present

## 2022-08-29 DIAGNOSIS — Z125 Encounter for screening for malignant neoplasm of prostate: Secondary | ICD-10-CM | POA: Diagnosis not present

## 2022-08-29 DIAGNOSIS — Z0000 Encounter for general adult medical examination without abnormal findings: Secondary | ICD-10-CM | POA: Diagnosis not present

## 2022-08-29 DIAGNOSIS — Z1211 Encounter for screening for malignant neoplasm of colon: Secondary | ICD-10-CM | POA: Diagnosis not present

## 2023-04-29 DIAGNOSIS — E039 Hypothyroidism, unspecified: Secondary | ICD-10-CM | POA: Diagnosis not present

## 2023-04-29 DIAGNOSIS — E785 Hyperlipidemia, unspecified: Secondary | ICD-10-CM | POA: Diagnosis not present

## 2023-06-10 DIAGNOSIS — Z85828 Personal history of other malignant neoplasm of skin: Secondary | ICD-10-CM | POA: Diagnosis not present

## 2023-06-10 DIAGNOSIS — L821 Other seborrheic keratosis: Secondary | ICD-10-CM | POA: Diagnosis not present

## 2023-06-10 DIAGNOSIS — D229 Melanocytic nevi, unspecified: Secondary | ICD-10-CM | POA: Diagnosis not present

## 2023-06-10 DIAGNOSIS — L570 Actinic keratosis: Secondary | ICD-10-CM | POA: Diagnosis not present

## 2023-06-10 DIAGNOSIS — T148XXA Other injury of unspecified body region, initial encounter: Secondary | ICD-10-CM | POA: Diagnosis not present

## 2023-06-10 DIAGNOSIS — D1801 Hemangioma of skin and subcutaneous tissue: Secondary | ICD-10-CM | POA: Diagnosis not present

## 2023-09-09 DIAGNOSIS — E039 Hypothyroidism, unspecified: Secondary | ICD-10-CM | POA: Diagnosis not present

## 2023-09-09 DIAGNOSIS — Z0000 Encounter for general adult medical examination without abnormal findings: Secondary | ICD-10-CM | POA: Diagnosis not present

## 2023-09-09 DIAGNOSIS — Z1211 Encounter for screening for malignant neoplasm of colon: Secondary | ICD-10-CM | POA: Diagnosis not present

## 2024-05-09 DIAGNOSIS — E785 Hyperlipidemia, unspecified: Secondary | ICD-10-CM | POA: Diagnosis not present

## 2024-05-09 DIAGNOSIS — E039 Hypothyroidism, unspecified: Secondary | ICD-10-CM | POA: Diagnosis not present

## 2024-05-09 DIAGNOSIS — Z131 Encounter for screening for diabetes mellitus: Secondary | ICD-10-CM | POA: Diagnosis not present
